# Patient Record
Sex: Male | Born: 1990 | Race: White | Hispanic: Yes | Marital: Single | Smoking: Never smoker
Health system: Southern US, Community
[De-identification: ages and names within clinical notes are randomized; demographics above are authoritative.]

---
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
## 2012-02-10 ENCOUNTER — Ambulatory Visit: Payer: Self-pay

## 2012-02-10 ENCOUNTER — Ambulatory Visit: Payer: Self-pay | Admitting: Internal Medicine

## 2012-02-10 VITALS — BP 138/77 | HR 67 | Temp 98.0°F | Resp 16 | Ht 73.0 in | Wt 177.0 lb

## 2012-02-10 DIAGNOSIS — R079 Chest pain, unspecified: Secondary | ICD-10-CM

## 2012-02-10 DIAGNOSIS — R0781 Pleurodynia: Secondary | ICD-10-CM

## 2012-02-10 NOTE — Progress Notes (Signed)
  Subjective:    Patient ID: Edgar Ray, male    DOB: 17-Dec-1990, 21 y.o.   MRN: [id number]  HPI he fell on concrete on Thanksgiving winning along the left anterolateral chest This was painful for several days but eventually he resumed work which requires lifting. Yesterday he sneezed and felt something pop and now has pain with movement or deep breathing. No shortness of breath or fever.    Review of Systems     Objective:   Physical Exam No acute distress Vital signs stable Pain with palpation the mid left anterior lateral chest No ecchymoses Pain with arm elevation/ Pain with twisting/pain with deep breathing      [facility] reading (PRIMARY) by  Dr. Dortha Kern tissue density around 6th rib near markers--?nondisplaced fx there/or healing fracture from 01/09/12 injury    Assessment & Plan:  Problem #1 chest wall pain secondary to fracture versus muscle injury Protect with rib brace Modify work until well Okay for nsaids/heat etc

## 2012-02-10 NOTE — Patient Instructions (Addendum)
Rib belt from pharmacy for pain control

## 2012-02-11 ENCOUNTER — Encounter: Payer: Self-pay | Admitting: Internal Medicine
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
## 2016-09-24 ENCOUNTER — Ambulatory Visit (INDEPENDENT_AMBULATORY_CARE_PROVIDER_SITE_OTHER): Payer: Self-pay | Admitting: Physician Assistant

## 2016-09-24 VITALS — BP 122/78 | HR 89 | Temp 99.6°F | Resp 16 | Ht 73.0 in | Wt 205.0 lb

## 2016-09-24 DIAGNOSIS — J020 Streptococcal pharyngitis: Secondary | ICD-10-CM

## 2016-09-24 LAB — POCT RAPID STREP A (OFFICE): Rapid Strep A Screen: NEGATIVE

## 2016-09-24 MED ORDER — AMOXICILLIN 500 MG PO CAPS: 500.0000 mg | ORAL_CAPSULE | Freq: Two times a day (BID) | ORAL | 0 refills | Status: DC

## 2016-09-24 NOTE — Patient Instructions (Addendum)
Take tylenol or ibuprofen for pain and fever. Hydrate well with water.  Please use cepacol lozenges as well for your throat pain  Pharyngitis Pharyngitis is redness, pain, and swelling (inflammation) of your pharynx. What are the causes? Pharyngitis is usually caused by infection. Most of the time, these infections are from viruses (viral) and are part of a cold. However, sometimes pharyngitis is caused by bacteria (bacterial). Pharyngitis can also be caused by allergies. Viral pharyngitis may be spread from person to person by coughing, sneezing, and personal items or utensils (cups, forks, spoons, toothbrushes). Bacterial pharyngitis may be spread from person to person by more intimate contact, such as kissing. What are the signs or symptoms? Symptoms of pharyngitis include:  Sore throat.  Tiredness (fatigue).  Low-grade fever.  Headache.  Joint pain and muscle aches.  Skin rashes.  Swollen lymph nodes.  Plaque-like film on throat or tonsils (often seen with bacterial pharyngitis).  How is this diagnosed? Your health care provider will ask you questions about your illness and your symptoms. Your medical history, along with a physical exam, is often all that is needed to diagnose pharyngitis. Sometimes, a rapid strep test is done. Other lab tests may also be done, depending on the suspected cause. How is this treated? Viral pharyngitis will usually get better in 3-4 days without the use of medicine. Bacterial pharyngitis is treated with medicines that kill germs (antibiotics). Follow these instructions at home:  Drink enough water and fluids to keep your urine clear or pale yellow.  Only take over-the-counter or prescription medicines as directed by your health care provider: ? If you are prescribed antibiotics, make sure you finish them even if you start to feel better. ? Do not take aspirin.  Get lots of rest.  Gargle with 8 oz of salt water ( tsp of salt per 1 qt of  water) as often as every 1-2 hours to soothe your throat.  Throat lozenges (if you are not at risk for choking) or sprays may be used to soothe your throat. Contact a health care provider if:  You have large, tender lumps in your neck.  You have a rash.  You cough up green, yellow-brown, or bloody spit. Get help right away if:  Your neck becomes stiff.  You drool or are unable to swallow liquids.  You vomit or are unable to keep medicines or liquids down.  You have severe pain that does not go away with the use of recommended medicines.  You have trouble breathing (not caused by a stuffy nose). This information is not intended to replace advice given to you by your health care provider. Make sure you discuss any questions you have with your health care provider. Document Released: 02/01/2005 Document Revised: 07/10/2015 Document Reviewed: 10/09/2012 Elsevier Interactive Patient Education  2017 [employer]     IF you received an x-ray today, you will receive an invoice from [facility]. Please contact [facility] at [phone] with questions or concerns regarding your invoice.   IF you received labwork today, you will receive an invoice from [facility]. Please contact [facility] at [phone] with questions or concerns regarding your invoice.   Our billing staff will not be able to assist you with questions regarding bills from these companies.  You will be contacted with the lab results as soon as they are available. The fastest way to get your results is to activate your My Chart account. Instructions are located on the last page of this paperwork. If  you have not heard from [place] regarding the results in 2 weeks, please contact this office.

## 2016-09-25 NOTE — Progress Notes (Signed)
[facility] [address], [place] [phone]  Date:  09/24/2016   Name:  Edgar Ray   DOB:  February 14, 1991   MRN:  [id number]  PCP:  Patient, No Pcp Per    History of Present Illness:  Edgar Ray is a 26 y.o. male patient who presents to PCP with  Chief Complaint  Patient presents with  . Sore Throat    x 4 days  . Fever    yesterday/ today 99.6     Patient is here for 4 days of worsening sore throat. Fever 103.1 2 days ago.  He has been using theraflu No ear pain, congestion, or cough.  No hx of strep.  No known sick contacts or around mono.   There are no active problems to display for this patient.   History reviewed. No pertinent past medical history.  History reviewed. No pertinent surgical history.  Social History  Substance Use Topics  . Smoking status: Former Smoker    Types: E-cigarettes  . Smokeless tobacco: Never Used  . Alcohol use Not on file    History reviewed. No pertinent family history.  Not on File  Medication list has been reviewed and updated.  No current outpatient prescriptions on file prior to visit.   No current facility-administered medications on file prior to visit.     ROS ROS otherwise unremarkable unless listed above.  Physical Examination: BP 122/78   Pulse 89   Temp 99.6 F (37.6 C) (Oral)   Resp 16   Ht 6\' 1"  (1.854 m)   Wt 205 lb (93 kg)   SpO2 100%   BMI 27.05 kg/m  Ideal Body Weight: Weight in (lb) to have BMI = 25: 189.1  Physical Exam  Constitutional: He is oriented to person, place, and time. He appears well-developed and well-nourished. No distress.  HENT:  Head: Normocephalic and atraumatic.  Right Ear: Tympanic membrane, external ear and ear canal normal.  Left Ear: Tympanic membrane, external ear and ear canal normal.  Nose: No mucosal edema or rhinorrhea. Right sinus exhibits no maxillary sinus tenderness and no frontal sinus tenderness. Left sinus exhibits  no maxillary sinus tenderness and no frontal sinus tenderness.  Mouth/Throat: No uvula swelling. Oropharyngeal exudate, posterior oropharyngeal edema and posterior oropharyngeal erythema present.  Eyes: Pupils are equal, round, and reactive to light. Conjunctivae, EOM and lids are normal. Right eye exhibits normal extraocular motion. Left eye exhibits normal extraocular motion.  Neck: Trachea normal and full passive range of motion without pain. No edema and no erythema present.  Cardiovascular: Normal rate.   Pulmonary/Chest: Effort normal. No respiratory distress. He has no decreased breath sounds. He has no wheezes. He has no rhonchi.  Neurological: He is alert and oriented to person, place, and time.  Skin: Skin is warm and dry. He is not diaphoretic.  Psychiatric: He has a normal mood and affect. His behavior is normal.     Assessment and Plan: Edgar Ray is a 26 y.o. male who is here today for cc of throat pain. Will treat presumptively  Culture obtained today. Streptococcal sore throat - Plan: POCT rapid strep A, Culture, Group A Strep, amoxicillin (AMOXIL) 500 MG capsule  Trena PlattStephanie English, PA-C [facility] 8/11/20188:25 AM

## 2016-09-27 LAB — CULTURE, GROUP A STREP

## 2017-05-16 ENCOUNTER — Encounter: Payer: Self-pay | Admitting: Physician Assistant
